# Patient Record
Sex: Male | Born: 1970 | Race: White | Hispanic: Yes | Marital: Married
Health system: Southern US, Community
[De-identification: ages and names within clinical notes are randomized; demographics above are authoritative.]

---

## 2006-06-07 ENCOUNTER — Emergency Department: Payer: Self-pay | Admitting: Emergency Medicine

## 2011-04-21 ENCOUNTER — Emergency Department: Payer: Self-pay | Admitting: Internal Medicine

## 2011-11-19 ENCOUNTER — Ambulatory Visit: Payer: Self-pay

## 2012-01-17 ENCOUNTER — Ambulatory Visit: Payer: Self-pay | Admitting: Gastroenterology

## 2012-02-12 ENCOUNTER — Other Ambulatory Visit: Payer: Self-pay | Admitting: Family

## 2012-02-12 LAB — APTT: Activated PTT: 40.5 secs — ABNORMAL HIGH (ref 23.6–35.9)

## 2012-02-14 ENCOUNTER — Ambulatory Visit: Payer: Self-pay | Admitting: Gastroenterology

## 2012-02-14 LAB — APTT: Activated PTT: 42 secs — ABNORMAL HIGH (ref 23.6–35.9)

## 2013-10-07 ENCOUNTER — Emergency Department: Payer: Self-pay | Admitting: Emergency Medicine

## 2014-04-26 ENCOUNTER — Ambulatory Visit: Payer: Self-pay

## 2014-07-05 IMAGING — CT CT HEAD WITHOUT CONTRAST
1 series · 16 of 30 positions shown, 20 images · non-contrast
Comparison: None.

CLINICAL DATA: Left arm numbness x1 week

EXAM:
CT HEAD WITHOUT CONTRAST
TECHNIQUE: Contiguous axial images were obtained from the base of the skull
through the vertex without intravenous contrast.

[Series 2: head wo · axial · 0.41mm/px · z∈[+203,+347]mm · 16 of 36 slices shown, 20 images]
[im 2/36  brain]
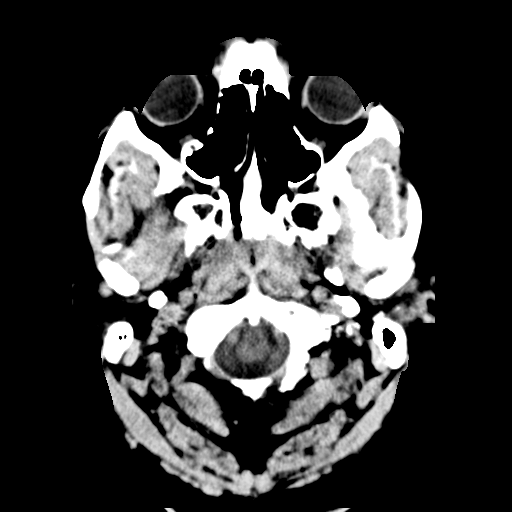
[im 2/36  bone]
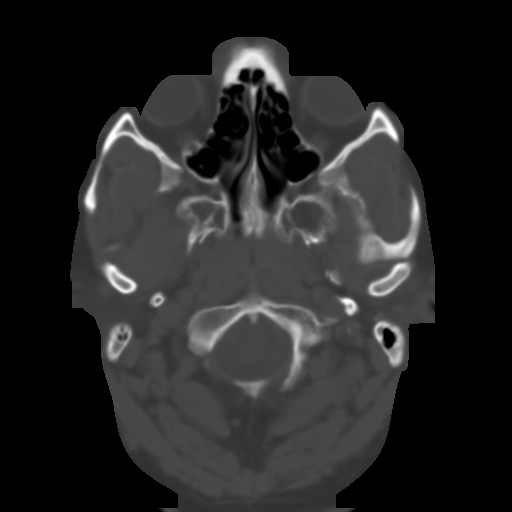
[im 4/36  brain]
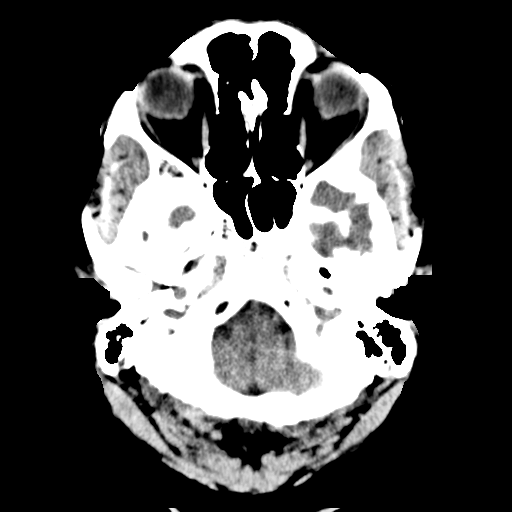
[im 7/36  brain]
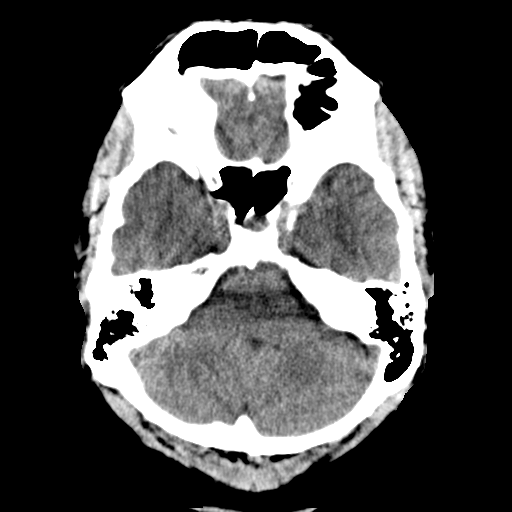
[im 9/36  brain]
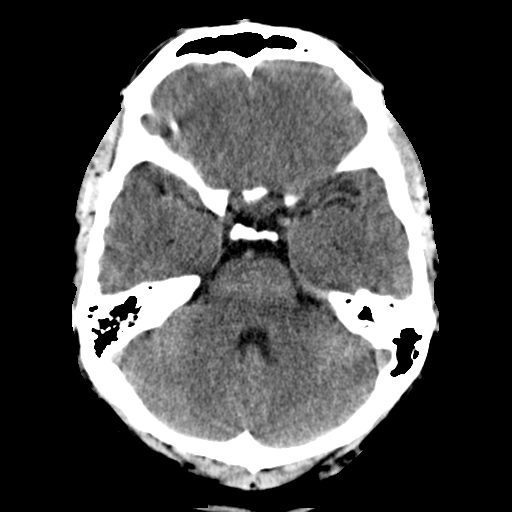
[im 10/36  brain]
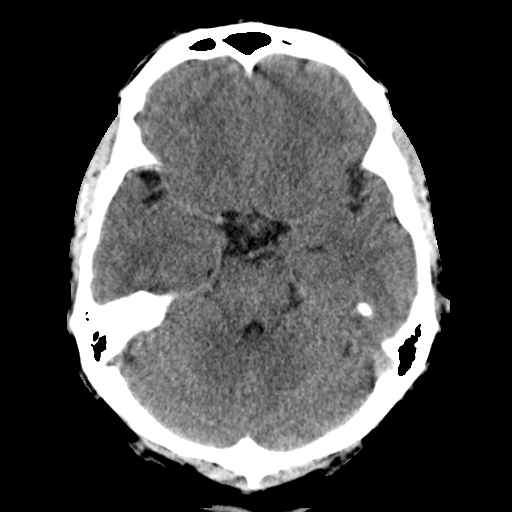
[im 10/36  bone]
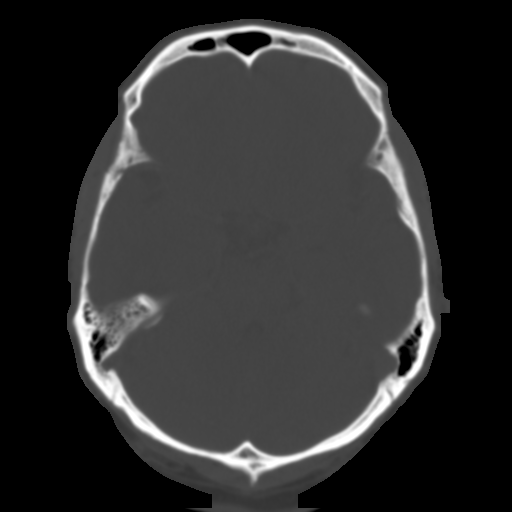
[im 13/36  brain]
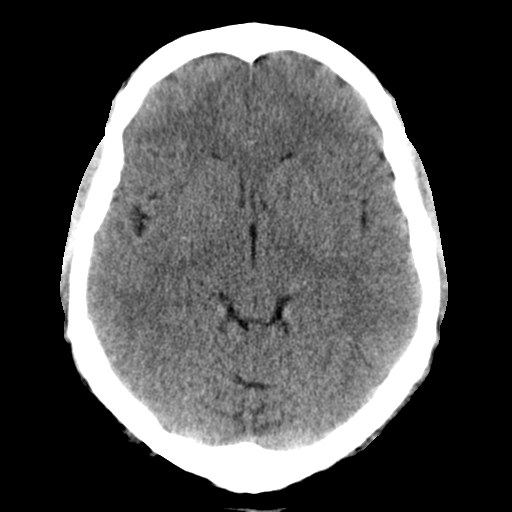
[im 15/36  brain]
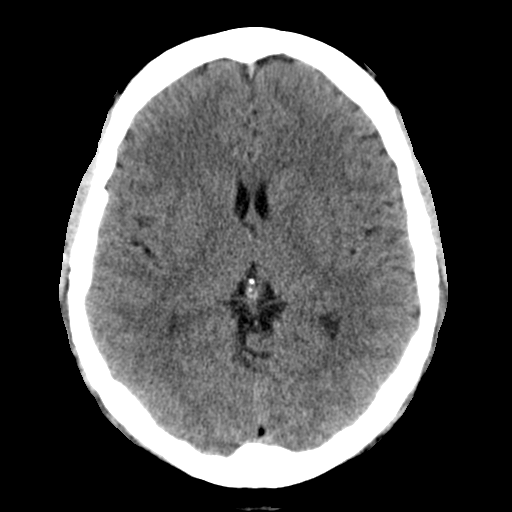
[im 17/36  brain]
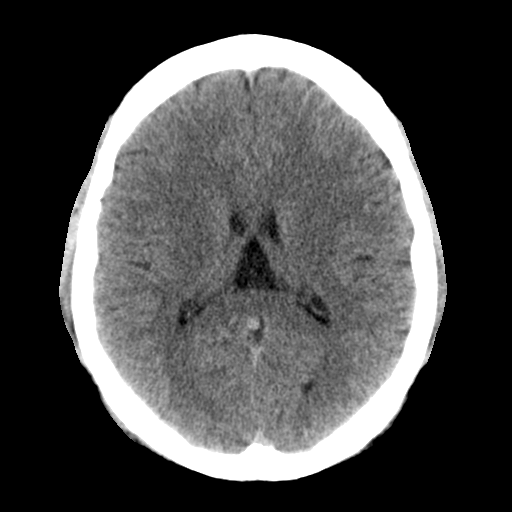
[im 19/36  brain]
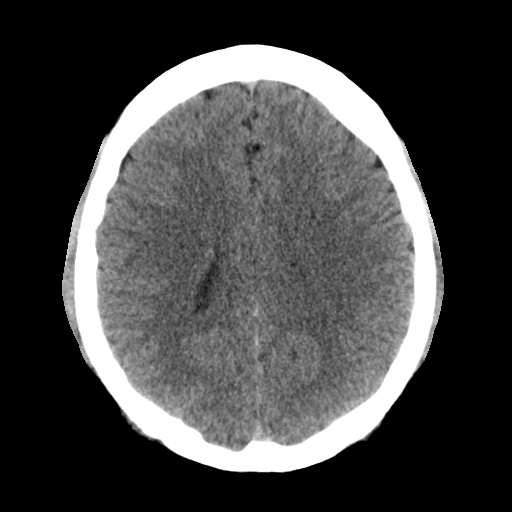
[im 19/36  bone]
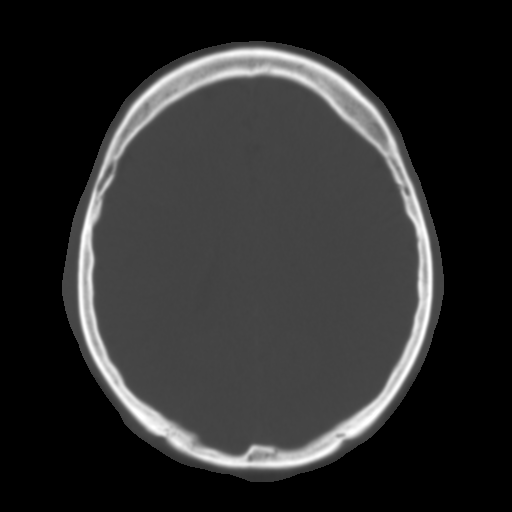
[im 21/36  brain]
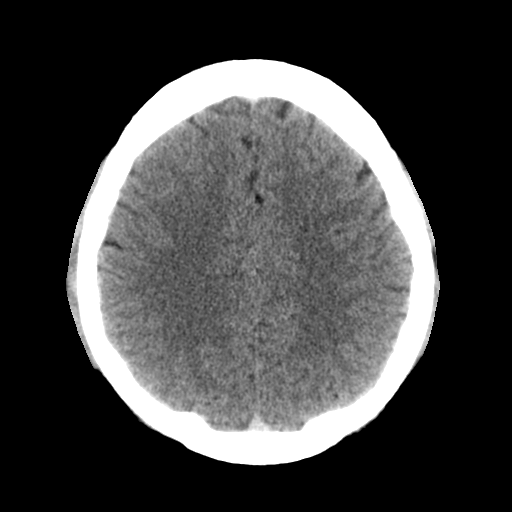
[im 23/36  brain]
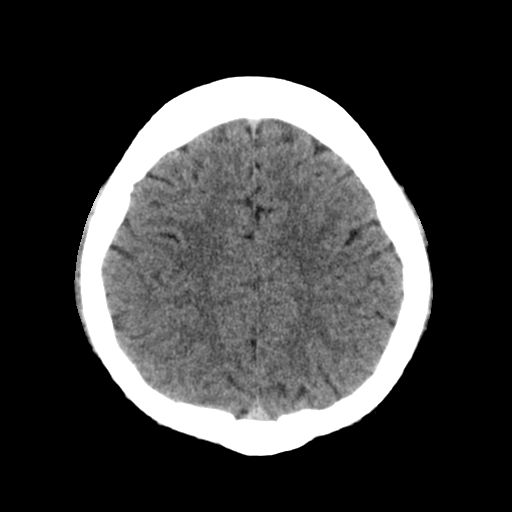
[im 26/36  brain]
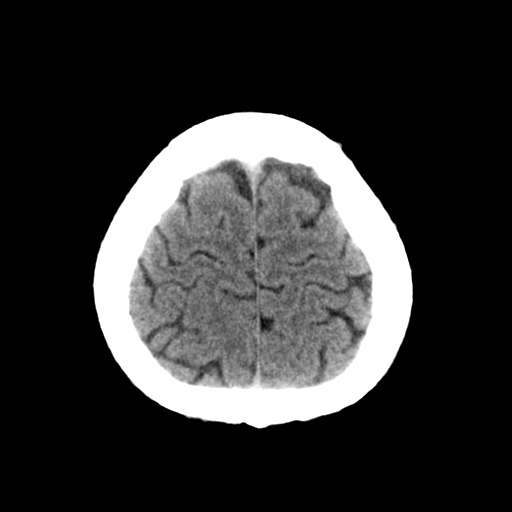
[im 27/36  brain]
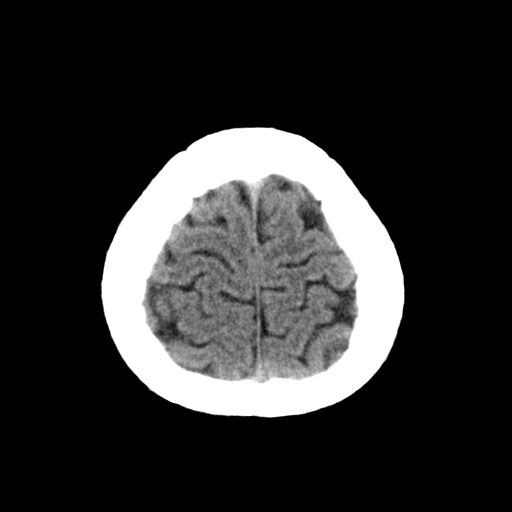
[im 27/36  bone]
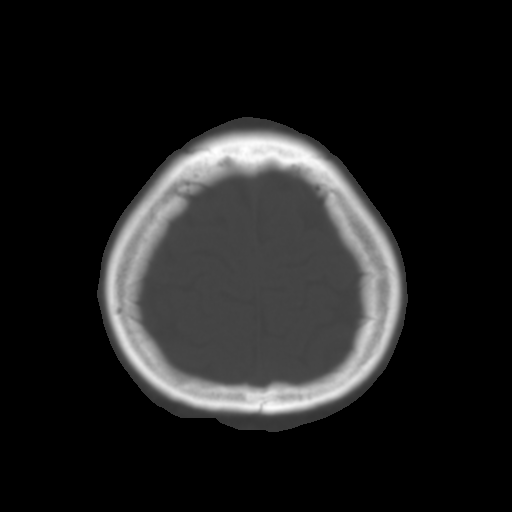
[im 29/36  brain]
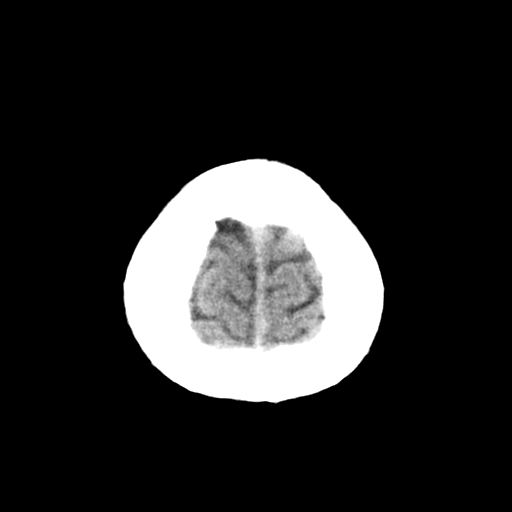
[im 32/36  brain]
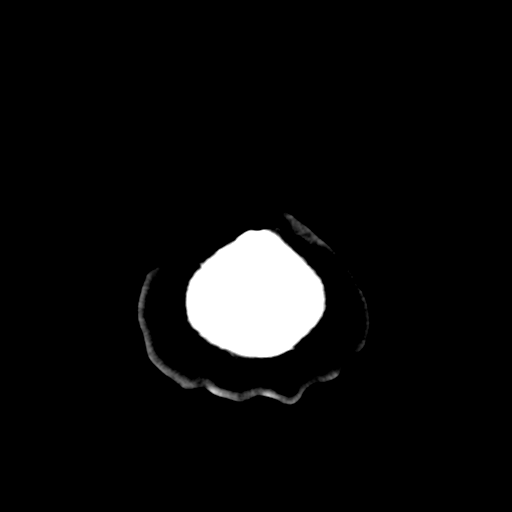
[im 34/36  brain]
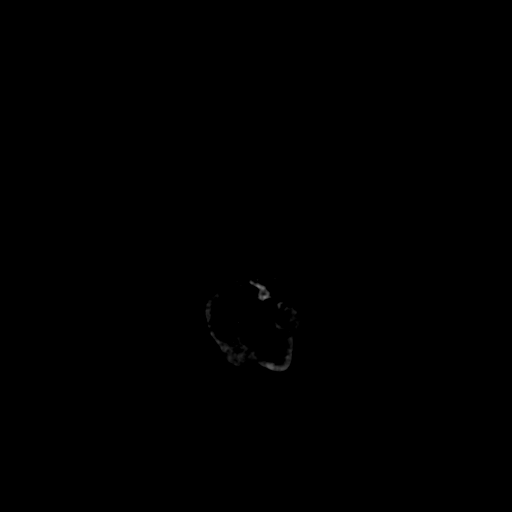

[16 of 30 positions shown; findings below may reference images not displayed]

FINDINGS: No evidence of parenchymal hemorrhage or extra-axial fluid
collection. No mass lesion, mass effect, or midline shift.

No CT evidence of acute infarction.

Cerebral volume is within normal limits.  No ventriculomegaly.

The visualized paranasal sinuses are essentially clear. The mastoid
air cells are unopacified.

No evidence of calvarial fracture.
IMPRESSION: Normal head CT.

## 2019-06-25 ENCOUNTER — Ambulatory Visit: Payer: Self-pay | Attending: Internal Medicine

## 2019-06-25 DIAGNOSIS — Z23 Encounter for immunization: Secondary | ICD-10-CM

## 2019-06-25 NOTE — Progress Notes (Signed)
   [facility]  Name:  Don Rodriguez    MRN: [id number] DOB: [birth year]  06/25/2019  Mr. Don Rodriguez was observed post Covid-19 immunization for 15 minutes without incident. He was provided with Vaccine Information Sheet and instruction to access the V-Safe system.   Mr. Don Rodriguez was instructed to call 911 with any severe reactions post vaccine: Marland Kitchen Difficulty breathing  . Swelling of face and throat  . A fast heartbeat  . A bad rash all over body  . Dizziness and weakness   Immunizations Administered    Name Date Dose VIS Date Route   Pfizer COVID-19 Vaccine 06/25/2019  1:08 PM 0.3 mL 03/19/2019 Intramuscular   Manufacturer: [employer], [employer]   Lot: [id number]   NDC: [address]-8

## 2019-07-16 ENCOUNTER — Ambulatory Visit: Payer: Self-pay | Attending: Internal Medicine

## 2019-07-16 DIAGNOSIS — Z23 Encounter for immunization: Secondary | ICD-10-CM

## 2019-07-16 NOTE — Progress Notes (Signed)
   Covid-[facility] Vaccination [facility]  Name:  Don Rodriguez    MRN: [id number] DOB: Aug 16, 1970  07/16/2019  Mr. Don Rodriguez was observed post Covid-19 immunization for 15 minutes without incident. He was provided with Vaccine Information Sheet and instruction to access the V-Safe system.   Mr. Don Rodriguez was instructed to call 911 with any severe reactions post vaccine: Marland Kitchen Difficulty breathing  . Swelling of face and throat  . A fast heartbeat  . A bad rash all over body  . Dizziness and weakness   Immunizations Administered    Name Date Dose VIS Date Route   Pfizer COVID-19 Vaccine 07/16/2019 12:16 PM 0.3 mL 03/19/2019 Intramuscular   Manufacturer: [employer], [employer]   Lot: [id number]   NDC: [address]-8

## 2022-07-19 ENCOUNTER — Encounter: Admission: RE | Payer: Self-pay | Source: Home / Self Care

## 2022-07-19 ENCOUNTER — Ambulatory Visit: Admission: RE | Admit: 2022-07-19 | Payer: BC Managed Care – PPO | Source: Home / Self Care

## 2022-07-19 SURGERY — COLONOSCOPY WITH PROPOFOL
Anesthesia: General
